# Patient Record
Sex: Male | Born: 1970 | Hispanic: No | Marital: Married | Smoking: Never smoker
Health system: Southern US, Community
[De-identification: ages and names within clinical notes are randomized; demographics above are authoritative.]

---

## 1999-12-25 ENCOUNTER — Emergency Department (HOSPITAL_COMMUNITY): Admission: EM | Admit: 1999-12-25 | Discharge: 1999-12-25 | Payer: Self-pay

## 2015-03-27 ENCOUNTER — Emergency Department (HOSPITAL_COMMUNITY): Payer: BLUE CROSS/BLUE SHIELD

## 2015-03-27 ENCOUNTER — Emergency Department (HOSPITAL_COMMUNITY)
Admission: EM | Admit: 2015-03-27 | Discharge: 2015-03-27 | Disposition: A | Discharge status: Home / Self Care | Payer: BLUE CROSS/BLUE SHIELD | Attending: Emergency Medicine | Admitting: Emergency Medicine

## 2015-03-27 ENCOUNTER — Encounter (HOSPITAL_COMMUNITY): Payer: Self-pay | Admitting: Emergency Medicine

## 2015-03-27 DIAGNOSIS — Y9389 Activity, other specified: Secondary | ICD-10-CM | POA: Diagnosis not present

## 2015-03-27 DIAGNOSIS — Y92410 Unspecified street and highway as the place of occurrence of the external cause: Secondary | ICD-10-CM | POA: Diagnosis not present

## 2015-03-27 DIAGNOSIS — S134XXA Sprain of ligaments of cervical spine, initial encounter: Secondary | ICD-10-CM | POA: Diagnosis not present

## 2015-03-27 DIAGNOSIS — Y998 Other external cause status: Secondary | ICD-10-CM | POA: Diagnosis not present

## 2015-03-27 DIAGNOSIS — S199XXA Unspecified injury of neck, initial encounter: Secondary | ICD-10-CM | POA: Diagnosis present

## 2015-03-27 MED ORDER — ONDANSETRON 4 MG PO TBDP
4.0000 mg | ORAL_TABLET | Freq: Once | ORAL | Status: AC
  Administered 2015-03-27: 4 mg via ORAL
  Filled 2015-03-27: qty 1

## 2015-03-27 MED ORDER — IBUPROFEN 800 MG PO TABS
800.0000 mg | ORAL_TABLET | Freq: Once | ORAL | Status: AC
  Administered 2015-03-27: 800 mg via ORAL
  Filled 2015-03-27: qty 1

## 2015-03-27 MED ORDER — IBUPROFEN 800 MG PO TABS: 800.0000 mg | ORAL_TABLET | Freq: Three times a day (TID) | ORAL | Status: DC

## 2015-03-27 MED ORDER — IBUPROFEN 800 MG PO TABS: 800.0000 mg | ORAL_TABLET | Freq: Three times a day (TID) | ORAL | Status: AC

## 2015-03-27 MED ORDER — CYCLOBENZAPRINE HCL 10 MG PO TABS: 10.0000 mg | ORAL_TABLET | Freq: Two times a day (BID) | ORAL | Status: AC | PRN

## 2015-03-27 MED ORDER — HYDROCODONE-ACETAMINOPHEN 5-325 MG PO TABS
1.0000 | ORAL_TABLET | Freq: Once | ORAL | Status: AC
  Administered 2015-03-27: 1 via ORAL
  Filled 2015-03-27: qty 1

## 2015-03-27 MED ORDER — CYCLOBENZAPRINE HCL 10 MG PO TABS: 10.0000 mg | ORAL_TABLET | Freq: Two times a day (BID) | ORAL | Status: DC | PRN
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
## 2015-03-27 NOTE — ED Notes (Signed)
Awake. Verbally responsive. A/O x4. Resp even and unlabored. No audible adventitious breath sounds noted. ABC's intact.  
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
